# Patient Record
Sex: Male | Born: 1995 | Race: White | Hispanic: No | Marital: Single | Smoking: Never smoker
Health system: Southern US, Community
[De-identification: ages and names within clinical notes are randomized; demographics above are authoritative.]

## PROBLEM LIST (undated history)

## (undated) DIAGNOSIS — F32A Depression, unspecified: Secondary | ICD-10-CM

---
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
## 2017-09-05 ENCOUNTER — Other Ambulatory Visit: Payer: Self-pay | Admitting: Student

## 2017-09-05 DIAGNOSIS — K921 Melena: Secondary | ICD-10-CM

## 2017-09-10 ENCOUNTER — Ambulatory Visit
Admission: RE | Admit: 2017-09-10 | Discharge: 2017-09-10 | Disposition: A | Discharge status: Home / Self Care | Payer: BLUE CROSS/BLUE SHIELD | Source: Ambulatory Visit | Attending: Student | Admitting: Student

## 2017-09-10 DIAGNOSIS — K921 Melena: Secondary | ICD-10-CM

## 2017-09-10 MED ORDER — IOPAMIDOL (ISOVUE-300) INJECTION 61%
100.0000 mL | Freq: Once | INTRAVENOUS | Status: AC | PRN
  Administered 2017-09-10: 100 mL via INTRAVENOUS

## 2020-12-22 ENCOUNTER — Ambulatory Visit: Payer: Self-pay

## 2021-01-11 ENCOUNTER — Ambulatory Visit: Payer: Self-pay

## 2021-09-20 ENCOUNTER — Other Ambulatory Visit: Payer: Self-pay

## 2021-09-20 ENCOUNTER — Other Ambulatory Visit: Payer: Self-pay | Admitting: Family Medicine

## 2021-09-20 ENCOUNTER — Ambulatory Visit
Admission: RE | Admit: 2021-09-20 | Discharge: 2021-09-20 | Disposition: A | Discharge status: Home / Self Care | Payer: Self-pay | Source: Ambulatory Visit | Attending: Family Medicine | Admitting: Family Medicine

## 2021-09-20 DIAGNOSIS — M25572 Pain in left ankle and joints of left foot: Secondary | ICD-10-CM

## 2022-11-28 ENCOUNTER — Encounter (HOSPITAL_COMMUNITY): Payer: Self-pay | Admitting: Emergency Medicine

## 2022-11-28 ENCOUNTER — Ambulatory Visit (INDEPENDENT_AMBULATORY_CARE_PROVIDER_SITE_OTHER): Payer: BC Managed Care – PPO

## 2022-11-28 ENCOUNTER — Other Ambulatory Visit: Payer: Self-pay

## 2022-11-28 ENCOUNTER — Ambulatory Visit (HOSPITAL_COMMUNITY)
Admission: EM | Admit: 2022-11-28 | Discharge: 2022-11-28 | Disposition: A | Discharge status: Home / Self Care | Payer: BC Managed Care – PPO

## 2022-11-28 DIAGNOSIS — S62306A Unspecified fracture of fifth metacarpal bone, right hand, initial encounter for closed fracture: Secondary | ICD-10-CM | POA: Diagnosis not present

## 2022-11-28 DIAGNOSIS — Z23 Encounter for immunization: Secondary | ICD-10-CM

## 2022-11-28 MED ORDER — ACETAMINOPHEN 325 MG PO TABS
ORAL_TABLET | ORAL | Status: AC
  Filled 2022-11-28: qty 2

## 2022-11-28 MED ORDER — TETANUS-DIPHTH-ACELL PERTUSSIS 5-2.5-18.5 LF-MCG/0.5 IM SUSY
0.5000 mL | PREFILLED_SYRINGE | Freq: Once | INTRAMUSCULAR | Status: AC
  Administered 2022-11-28: 0.5 mL via INTRAMUSCULAR

## 2022-11-28 MED ORDER — TRAMADOL HCL 50 MG PO TABS: 50.0000 mg | ORAL_TABLET | Freq: Four times a day (QID) | ORAL | 0 refills | Status: AC | PRN

## 2022-11-28 MED ORDER — TETANUS-DIPHTH-ACELL PERTUSSIS 5-2.5-18.5 LF-MCG/0.5 IM SUSY
PREFILLED_SYRINGE | INTRAMUSCULAR | Status: AC
  Filled 2022-11-28: qty 0.5

## 2022-11-28 MED ORDER — ACETAMINOPHEN 325 MG PO TABS
650.0000 mg | ORAL_TABLET | Freq: Once | ORAL | Status: AC
  Administered 2022-11-28: 650 mg via ORAL

## 2022-11-28 MED ORDER — TRAMADOL HCL 50 MG PO TABS: 50.0000 mg | ORAL_TABLET | Freq: Four times a day (QID) | ORAL | 0 refills | Status: DC | PRN

## 2022-11-28 NOTE — ED Triage Notes (Signed)
Patient punched the ground.  This occurred today.  Pain in right hand.  There is a small bleeding scratch to right hand, bruising, swelling and pain to area of hand at base of ring and little finger.    Last tetanus [place] unknown.    Has not had any medications for pain

## 2022-11-28 NOTE — ED Notes (Signed)
Notified ortho tech ?

## 2022-11-28 NOTE — Discharge Instructions (Signed)
You have fractured the fifth metacarpal bone in your right hand.  Please keep splint in place.  Take prescribed pain medication for severe pain.  Otherwise you can take Tylenol 975 mg every 4 hours as needed.  Call the referral number tomorrow to schedule appointment for follow-up.

## 2022-11-28 NOTE — ED Provider Notes (Signed)
[facility]    CSN: [id number] Arrival date & time: 11/28/22  1850      History   Chief Complaint Chief Complaint  Patient presents with   Hand Injury    HPI William Wolfe is a 27 y.o. male presents urgent care today with right hand injury.  Patient reports becoming angry and punching ground with immediate pain in right hand at base of fourth and fifth fingers.  Patient denies any loss of sensation, numbness or tingling.    History reviewed. No pertinent past medical history.  There are no problems to display for this patient.   History reviewed. No pertinent surgical history.     Home Medications    Prior to Admission medications   Medication Sig Start Date End Date Taking? Authorizing Provider  buPROPion (WELLBUTRIN XL) 300 MG 24 hr tablet Take 300 mg by mouth every morning. 10/24/22  Yes [provider]  busPIRone (BUSPAR) 7.5 MG tablet Take 7.5 mg by mouth 2 (two) times daily. 11/27/22  Yes [provider]  hydrOXYzine (ATARAX) 25 MG tablet Take 25 mg by mouth 3 (three) times daily as needed. 10/31/22  Yes [provider]  traMADol (ULTRAM) 50 MG tablet Take 1 tablet (50 mg total) by mouth every 6 (six) hours as needed for up to 5 days. 11/28/22 12/03/22  Rolla Etienne, NP    Family History History reviewed. No pertinent family history.  Social History Social History   Tobacco Use   Smoking status: Never   Smokeless tobacco: Never  Vaping Use   Vaping Use: Former  Substance Use Topics   Alcohol use: Not Currently   Drug use: Not Currently    Types: Marijuana     Allergies   Patient has no known allergies.   Review of Systems As stated in HPI otherwise negative   Physical Exam Triage Vital Signs ED Triage Vitals  Enc Vitals Group     BP 11/28/22 1934 (!) 139/98     Pulse Rate 11/28/22 1934 (!) 107     Resp 11/28/22 1934 18     Temp 11/28/22 1934 98.8 F (37.1 C)     Temp Source 11/28/22 1934 Oral      SpO2 11/28/22 1934 97 %     Weight --      Height --      Head Circumference --      Peak Flow --      Pain Score 11/28/22 1931 8     Pain Loc --      Pain Edu? --      Excl. in GC? --    No data found.  Updated Vital Signs BP (!) 139/98 (BP Location: Left Arm)   Pulse (!) 107   Temp 98.8 F (37.1 C) (Oral)   Resp 18   SpO2 97%   Visual Acuity Right Eye Distance:   Left Eye Distance:   Bilateral Distance:    Right Eye Near:   Left Eye Near:    Bilateral Near:     Physical Exam Constitutional:      Appearance: Normal appearance.     Comments: anxious  Skin:    General: Skin is warm and dry.  Neurological:     Mental Status: He is alert.      UC Treatments / Results  Labs (all labs ordered are listed, but only abnormal results are displayed) Labs Reviewed - No data to display  EKG   Radiology DG Hand  Complete Right  Result Date: 11/28/2022 CLINICAL DATA:  Punched the ground, bruising and swelling, pain EXAM: RIGHT HAND - COMPLETE 3+ VIEW COMPARISON:  None Available. FINDINGS: Frontal, oblique, and lateral views of the right hand are obtained. There is a comminuted extra-articular fracture of the distal margin of the fifth metacarpal, with ventral angulation at the fracture site. Overlying soft tissue swelling. No other acute bony abnormalities. Joint spaces are well preserved. IMPRESSION: 1. Comminuted fifth metacarpal diaphyseal fracture, with ventral angulation at the fracture site. 2. Soft tissue swelling throughout the ulnar aspect of the hand. Electronically Signed   By: Sharlet Salina M.D.   On: 11/28/2022 20:05    Procedures Procedures (including critical care time)  Medications Ordered in UC Medications  acetaminophen (TYLENOL) tablet 650 mg (650 mg Oral Given 11/28/22 1941)  Tdap (BOOSTRIX) injection 0.5 mL (0.5 mLs Intramuscular Given 11/28/22 2012)    Initial Impression / Assessment and Plan / UC Course  I have reviewed the triage vital signs and  the nursing notes.  Pertinent labs & imaging results that were available during my care of the patient were reviewed by me and considered in my medical decision making (see chart for details).     *** Final Clinical Impressions(s) / UC Diagnoses   Final diagnoses:  Closed nondisplaced fracture of fifth metacarpal bone of right hand, unspecified portion of metacarpal, initial encounter     Discharge Instructions      You have fractured the fifth metacarpal bone in your right hand.  Please keep splint in place.  Take prescribed pain medication for severe pain.  Otherwise you can take Tylenol 975 mg every 4 hours as needed.  Call the referral number tomorrow to schedule appointment for follow-up.      ED Prescriptions     Medication Sig Dispense Auth. Provider   traMADol (ULTRAM) 50 MG tablet  (Status: Discontinued) Take 1 tablet (50 mg total) by mouth every 6 (six) hours as needed for up to 5 days. 15 tablet Rolla Etienne, NP   traMADol (ULTRAM) 50 MG tablet Take 1 tablet (50 mg total) by mouth every 6 (six) hours as needed for up to 5 days. 15 tablet Rolla Etienne, NP      I have reviewed the PDMP during this encounter.

## 2022-11-28 NOTE — ED Notes (Signed)
Provided ice pack

## 2023-02-24 ENCOUNTER — Ambulatory Visit (HOSPITAL_COMMUNITY)
Admission: RE | Admit: 2023-02-24 | Discharge: 2023-02-24 | Disposition: A | Discharge status: Home / Self Care | Payer: BC Managed Care – PPO | Source: Ambulatory Visit | Attending: Family Medicine | Admitting: Family Medicine

## 2023-02-24 ENCOUNTER — Encounter (HOSPITAL_COMMUNITY): Payer: Self-pay

## 2023-02-24 VITALS — BP 111/75 | HR 79 | Temp 98.0°F | Resp 16

## 2023-02-24 DIAGNOSIS — K529 Noninfective gastroenteritis and colitis, unspecified: Secondary | ICD-10-CM

## 2023-02-24 HISTORY — DX: Depression, unspecified: F32.A

## 2023-02-24 NOTE — Discharge Instructions (Signed)
It was nice seeing you today. You likely have viral diarrhea and this is self-limiting. Please keep well hydrated and rested. Return soon if symptoms worsens.

## 2023-02-24 NOTE — ED Provider Notes (Signed)
[facility]    CSN: [id number] Arrival date & time: 02/24/23  1514      History   Chief Complaint Chief Complaint  Patient presents with   Nausea    Stomach upset as well - Entered by patient   Abdominal Pain    HPI William Wolfe is a 27 y.o. male.   The history is provided by the patient. No language interpreter was used.  Diarrhea Diarrhea characteristics: Watery stool started yesterday. Severity:  Moderate Onset quality:  Gradual Number of episodes:  5 times yesterday and 3 times today Timing:  Intermittent Progression:  Improving Relieved by: PeptoBitmus. Worsened by:  Nothing Associated symptoms: abdominal pain   Associated symptoms: no fever   Associated symptoms comment:  He threw up a little bit this morning, hence did not go to work. His abdominal pain has improved to 2/10 in severity Risk factors: no sick contacts, no suspicious food intake and no travel to endemic areas     Past Medical History:  Diagnosis Date   Depression     There are no problems to display for this patient.   History reviewed. No pertinent surgical history.     Home Medications    Prior to Admission medications   Medication Sig Start Date End Date Taking? Authorizing Provider  buPROPion (WELLBUTRIN XL) 300 MG 24 hr tablet Take 300 mg by mouth every morning. 10/24/22   [provider]  busPIRone (BUSPAR) 7.5 MG tablet Take 7.5 mg by mouth 2 (two) times daily. 11/27/22   [provider]  hydrOXYzine (ATARAX) 25 MG tablet Take 25 mg by mouth 3 (three) times daily as needed. 10/31/22   [provider]    Family History History reviewed. No pertinent family history.  Social History Social History   Tobacco Use   Smoking status: Never   Smokeless tobacco: Never  Vaping Use   Vaping status: Former  Substance Use Topics   Alcohol use: Not Currently   Drug use: Not Currently    Types: Marijuana     Allergies   Patient has no known  allergies.   Review of Systems Review of Systems  Constitutional:  Negative for fever.  Gastrointestinal:  Positive for abdominal pain.     Physical Exam Triage Vital Signs ED Triage Vitals  Encounter Vitals Group     BP 02/24/23 1538 111/75     Systolic BP Percentile --      Diastolic BP Percentile --      Pulse Rate 02/24/23 1538 79     Resp 02/24/23 1538 16     Temp 02/24/23 1538 98 F (36.7 C)     Temp Source 02/24/23 1538 Oral     SpO2 02/24/23 1538 97 %     Weight --      Height --      Head Circumference --      Peak Flow --      Pain Score 02/24/23 1537 0     Pain Loc --      Pain Education --      Exclude from Growth Chart --    No data found.  Updated Vital Signs BP 111/75 (BP Location: Right Arm)   Pulse 79   Temp 98 F (36.7 C) (Oral)   Resp 16   SpO2 97%   Visual Acuity Right Eye Distance:   Left Eye Distance:   Bilateral Distance:    Right Eye Near:   Left Eye Near:  Bilateral Near:     Physical Exam Constitutional:      General: He is not in acute distress.    Appearance: He is not ill-appearing, toxic-appearing or diaphoretic.     Comments: Well hydrated      UC Treatments / Results  Labs (all labs ordered are listed, but only abnormal results are displayed) Labs Reviewed - No data to display  EKG   Radiology No results found.  Procedures Procedures (including critical care time)  Medications Ordered in UC Medications - No data to display  Initial Impression / Assessment and Plan / UC Course  I have reviewed the triage vital signs and the nursing notes.  Pertinent labs & imaging results that were available during my care of the patient were reviewed by me and considered in my medical decision making (see chart for details).     Gastroenteritis: Likely viral infection He is well hydrated Seems to be improving Keep well hydrated and rested discussed Return as needed    Final Clinical Impressions(s) / UC  Diagnoses   Final diagnoses:  Gastroenteritis     Discharge Instructions      It was nice seeing you today. You likely have viral diarrhea and this is self-limiting. Please keep well hydrated and rested. Return soon if symptoms worsens.     ED Prescriptions   None    PDMP not reviewed this encounter.   Doreene Eland, MD 02/24/23 [phone]

## 2023-02-24 NOTE — ED Triage Notes (Signed)
Pt reports lower abd pains and nausea for past two days and been out of work so needs note. Had watery bowels, denies urinary problems.

## 2024-01-15 IMAGING — CR DG FOOT 2V*L*
2 series · 2 of 2 positions shown · non-contrast
Comparison: None.

CLINICAL DATA: Acute pain

EXAM:
LEFT FOOT - 2 VIEW

[x foot ap left]
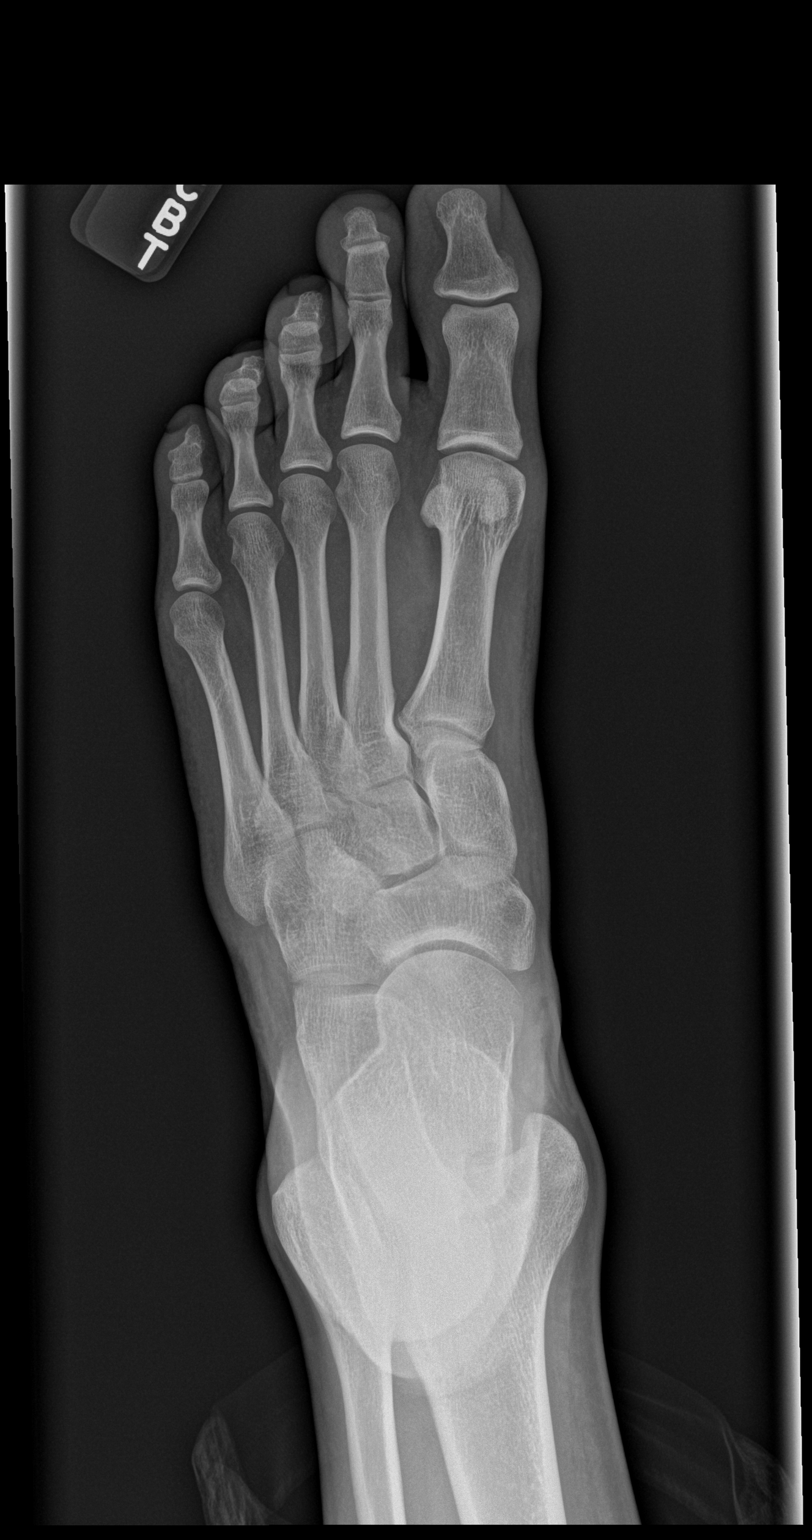

[x foot lat left]
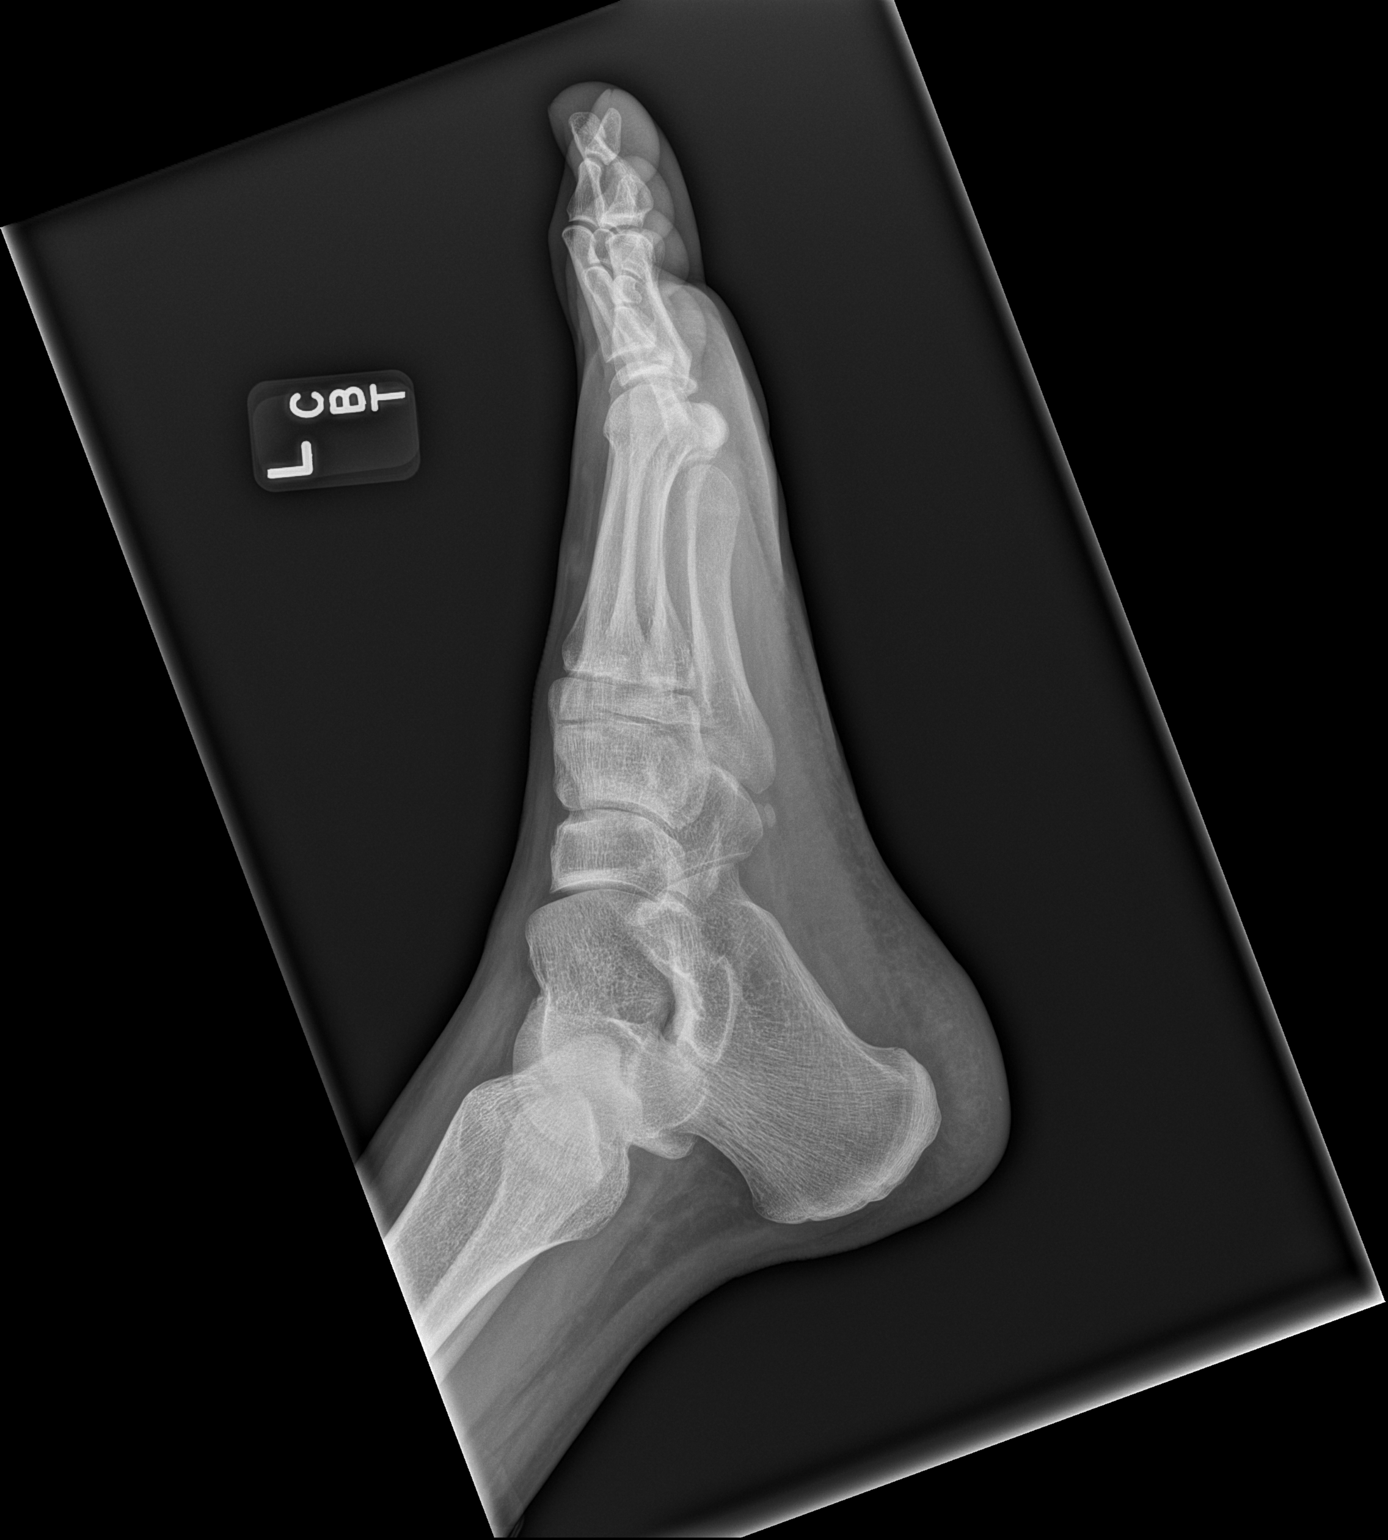

[2 of 2 positions shown; findings below may reference images not displayed]

FINDINGS: There is no evidence of fracture or dislocation. There is no
evidence of arthropathy or other focal bone abnormality. Soft
tissues are unremarkable.
IMPRESSION: Negative.

## 2024-01-15 IMAGING — CR DG ANKLE 2V *L*
2 series · 2 of 2 positions shown · non-contrast
Comparison: None.

CLINICAL DATA: Acute pain

EXAM:
LEFT ANKLE - 2 VIEW

[x ankle ap left]
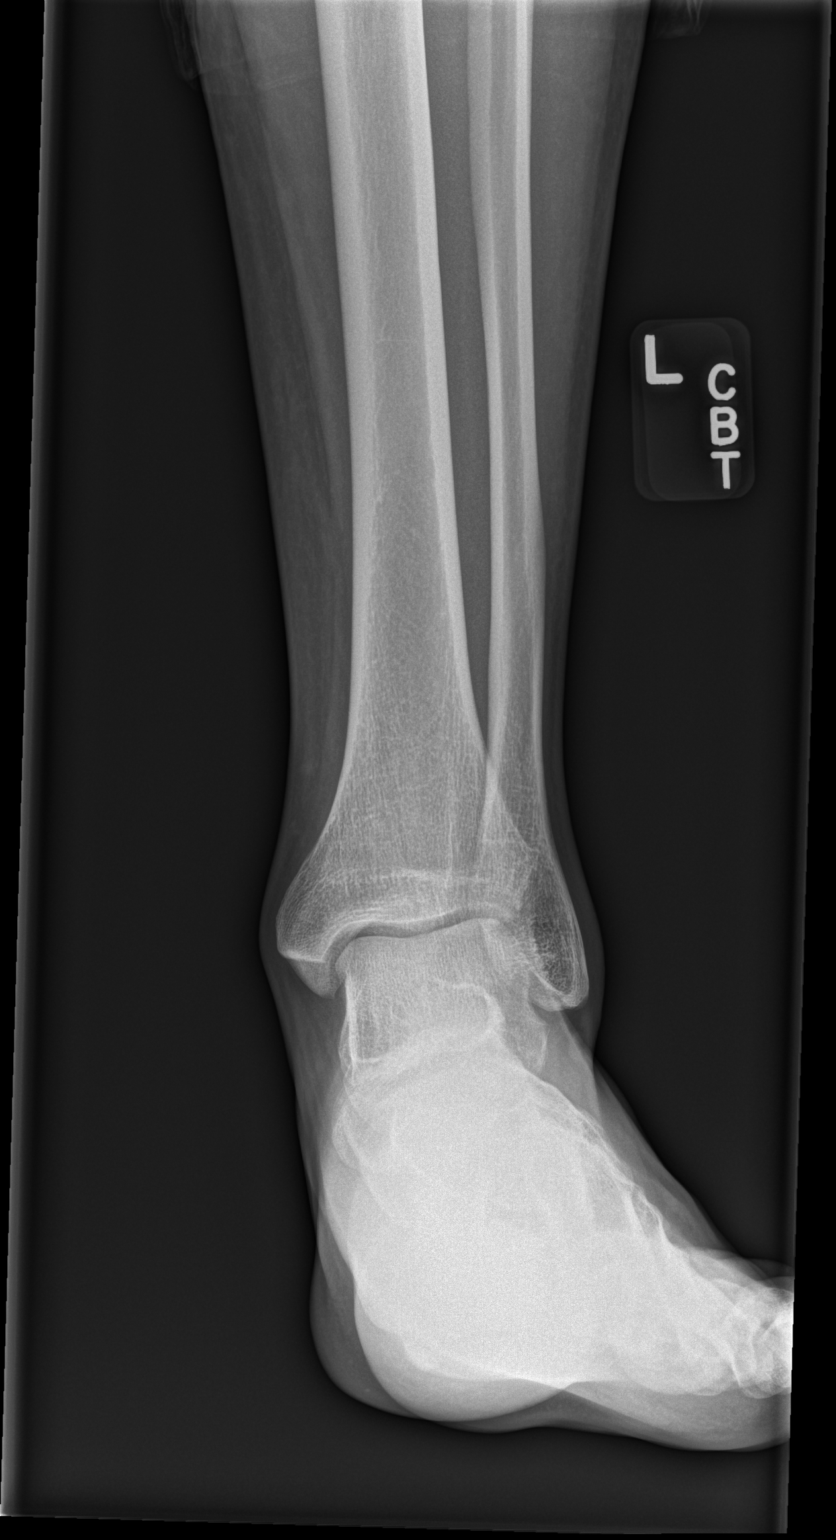

[x ankle lat left]
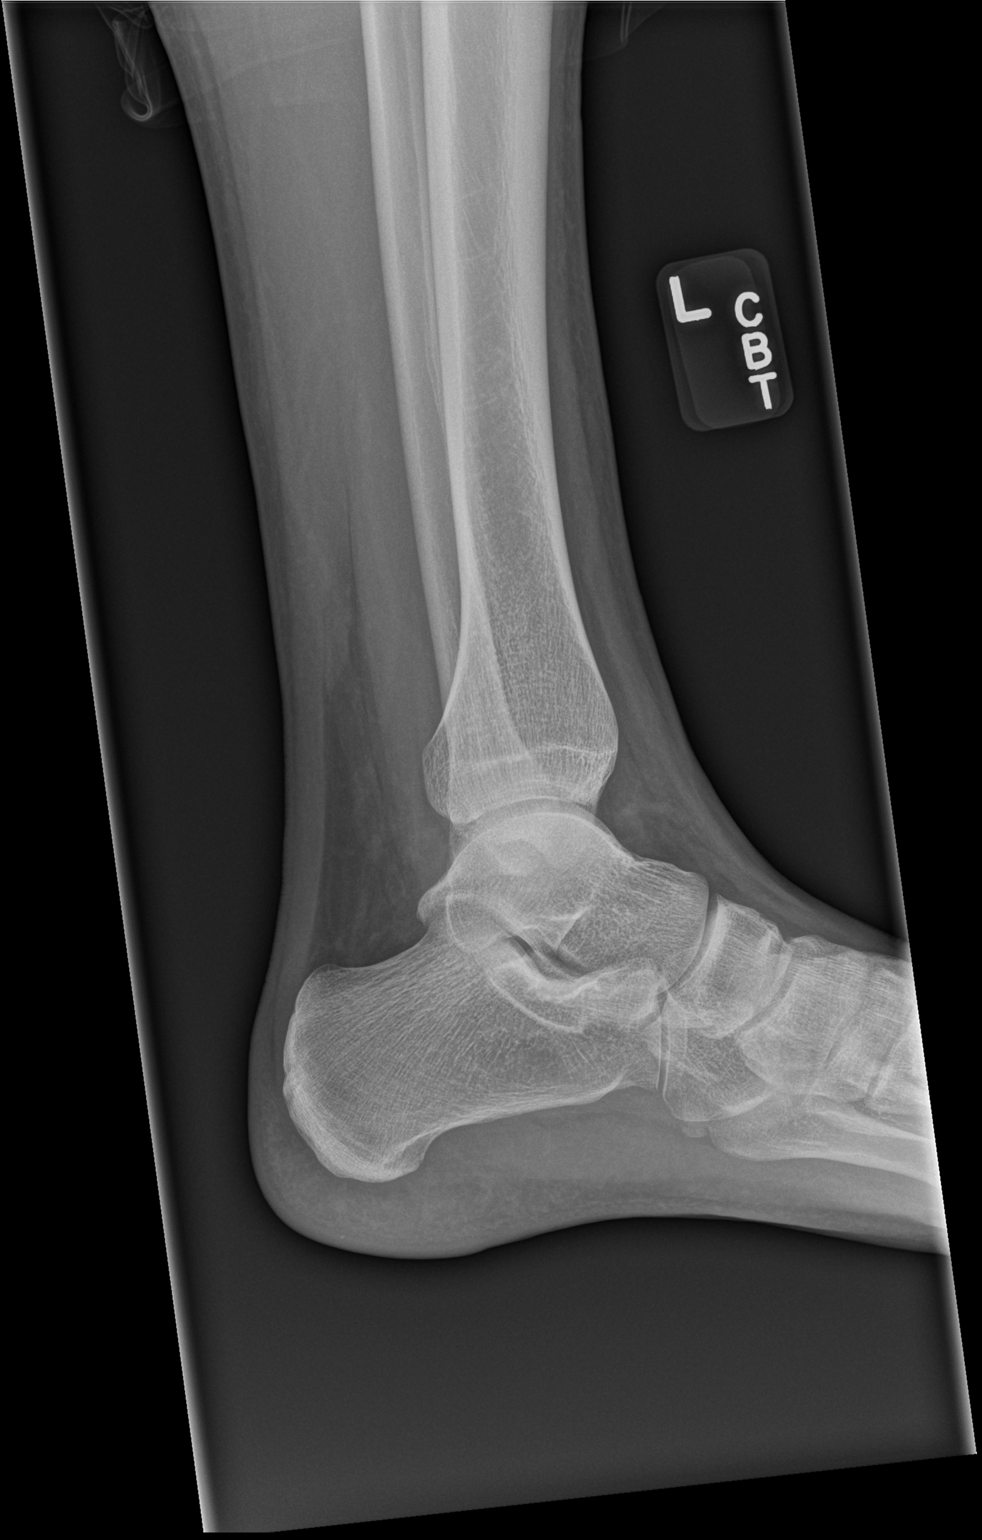

[2 of 2 positions shown; findings below may reference images not displayed]

FINDINGS: There is no evidence of fracture, dislocation, or joint effusion.
There is no evidence of arthropathy or other focal bone abnormality.
Soft tissues are unremarkable.
IMPRESSION: Negative.
